# Patient Record
Sex: Male | Born: 1980 | Race: Asian | Hispanic: No | Marital: Single | Smoking: Current every day smoker
Health system: Southern US, Community
[De-identification: ages and names within clinical notes are randomized; demographics above are authoritative.]

---

## 2003-07-15 ENCOUNTER — Encounter: Payer: Self-pay | Admitting: Chiropractic Medicine

## 2003-07-15 ENCOUNTER — Encounter: Admission: RE | Admit: 2003-07-15 | Discharge: 2003-07-15 | Payer: Self-pay | Admitting: Chiropractic Medicine

## 2011-01-29 ENCOUNTER — Emergency Department (HOSPITAL_COMMUNITY)
Admission: EM | Admit: 2011-01-29 | Discharge: 2011-01-30 | Disposition: A | Discharge status: Home / Self Care | Payer: Worker's Compensation | Attending: Emergency Medicine | Admitting: Emergency Medicine

## 2011-01-29 DIAGNOSIS — S61209A Unspecified open wound of unspecified finger without damage to nail, initial encounter: Secondary | ICD-10-CM | POA: Insufficient documentation

## 2011-01-29 DIAGNOSIS — W319XXA Contact with unspecified machinery, initial encounter: Secondary | ICD-10-CM | POA: Insufficient documentation

## 2011-01-29 DIAGNOSIS — Y9269 Other specified industrial and construction area as the place of occurrence of the external cause: Secondary | ICD-10-CM | POA: Insufficient documentation

## 2011-01-29 DIAGNOSIS — Y990 Civilian activity done for income or pay: Secondary | ICD-10-CM | POA: Insufficient documentation

## 2011-01-30 ENCOUNTER — Emergency Department (HOSPITAL_COMMUNITY): Payer: Worker's Compensation

## 2011-09-10 IMAGING — CR DG FINGER RING 2+V*L*
3 series · 3 of 3 positions shown · non-contrast
Comparison: None.

CLINICAL DATA: Laceration to the left ring finger.

LEFT RING FINGER 2+V

[x finger pa left]
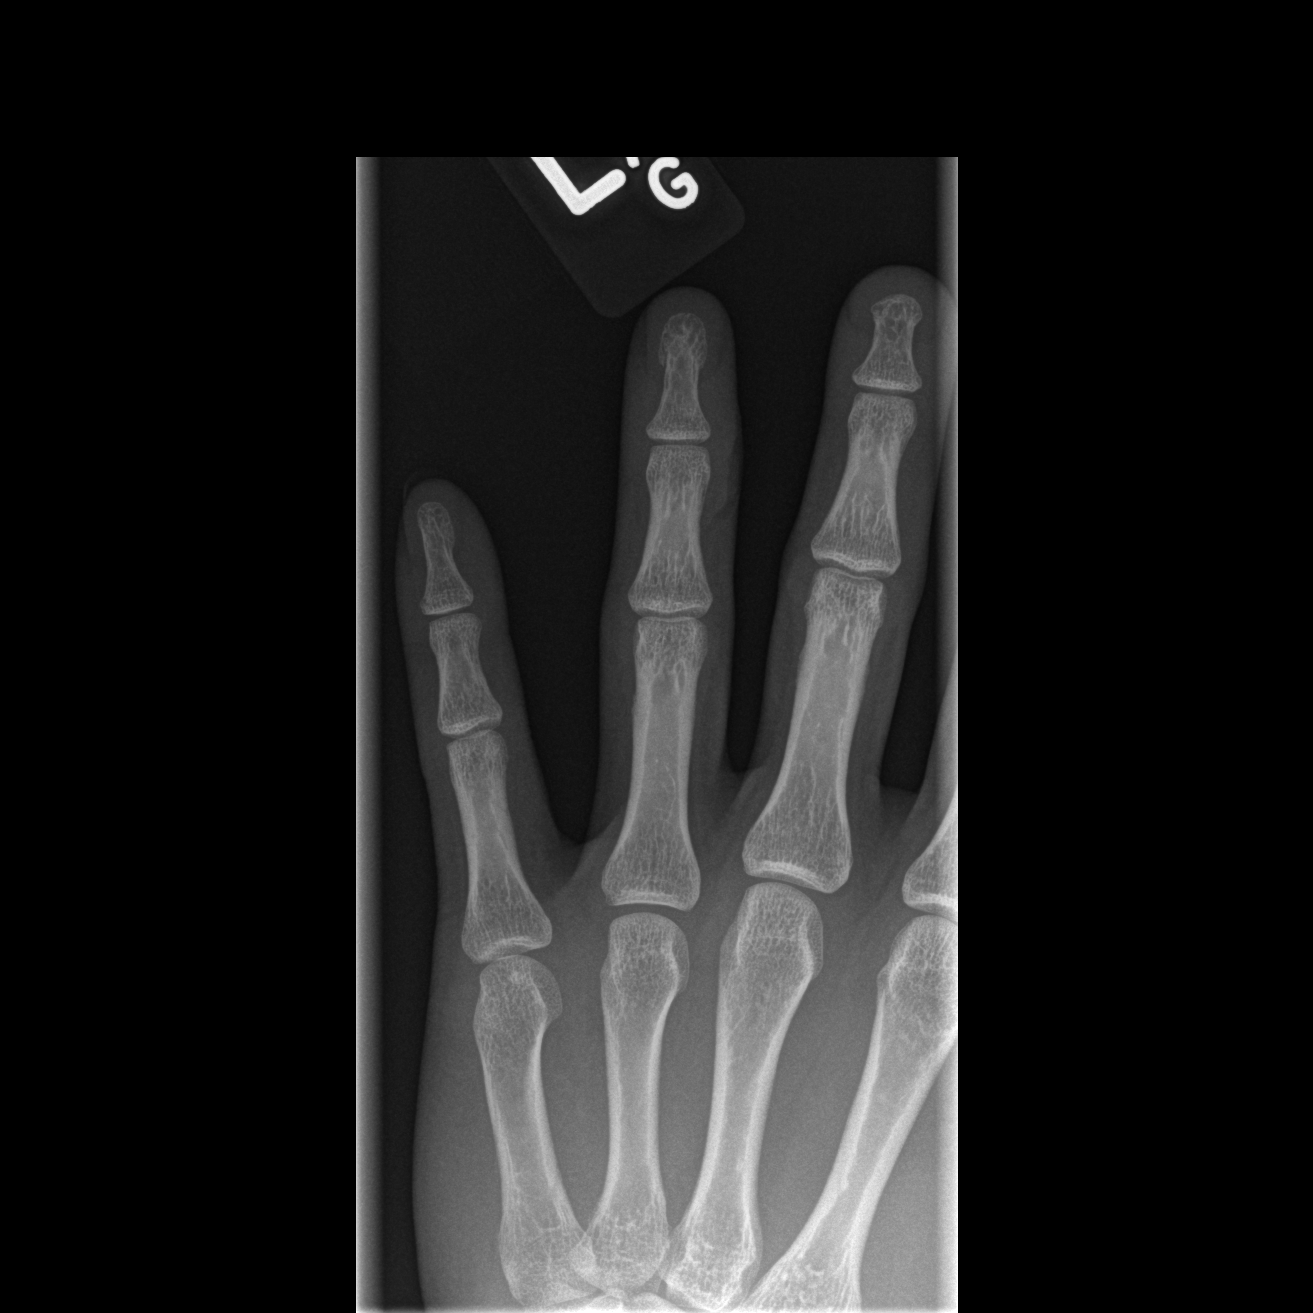

[x finger obl. left]
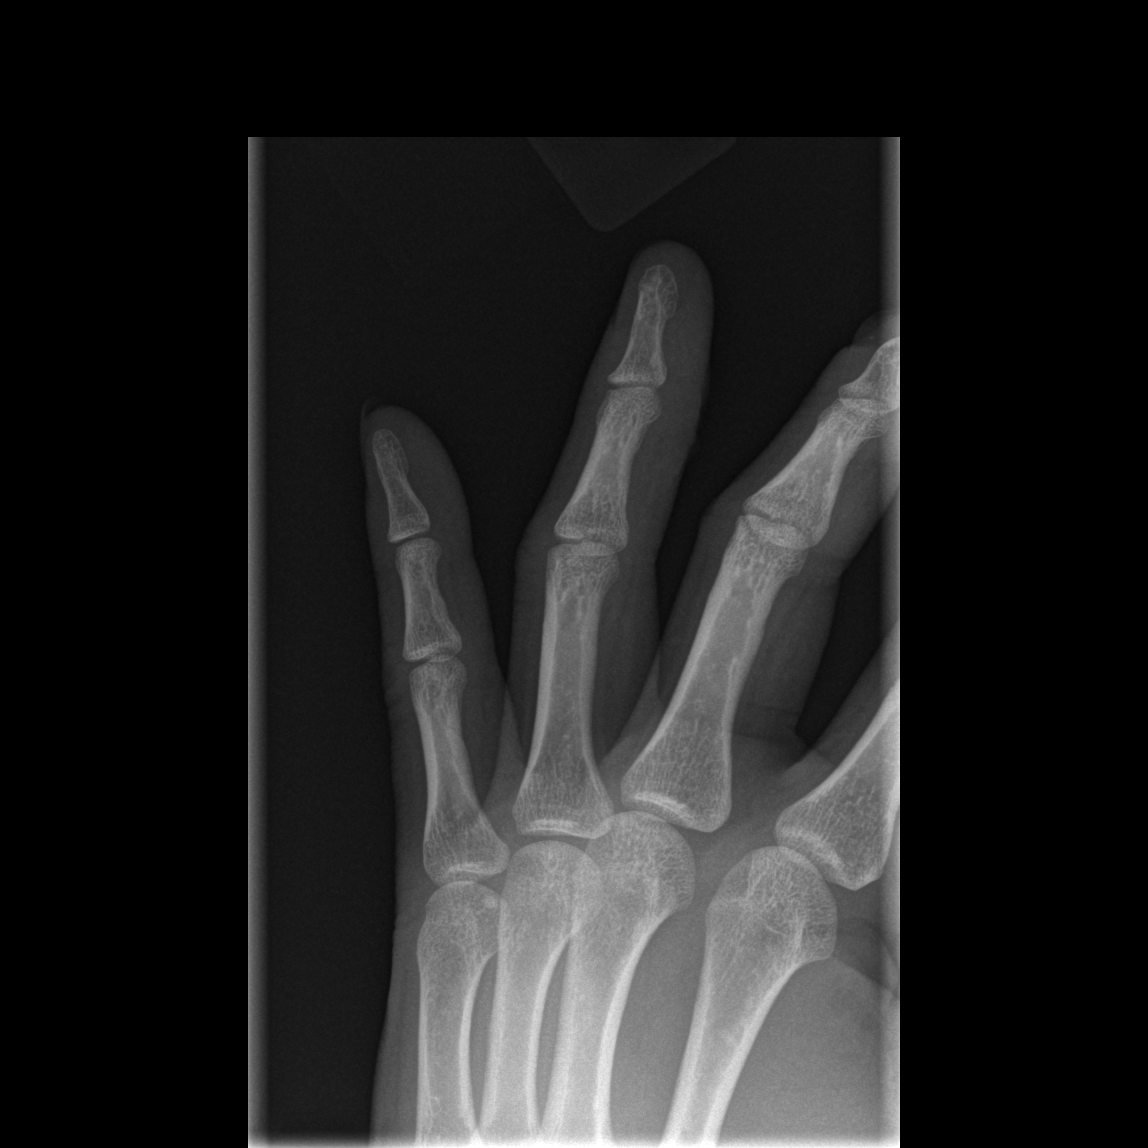

[x finger lateral left]
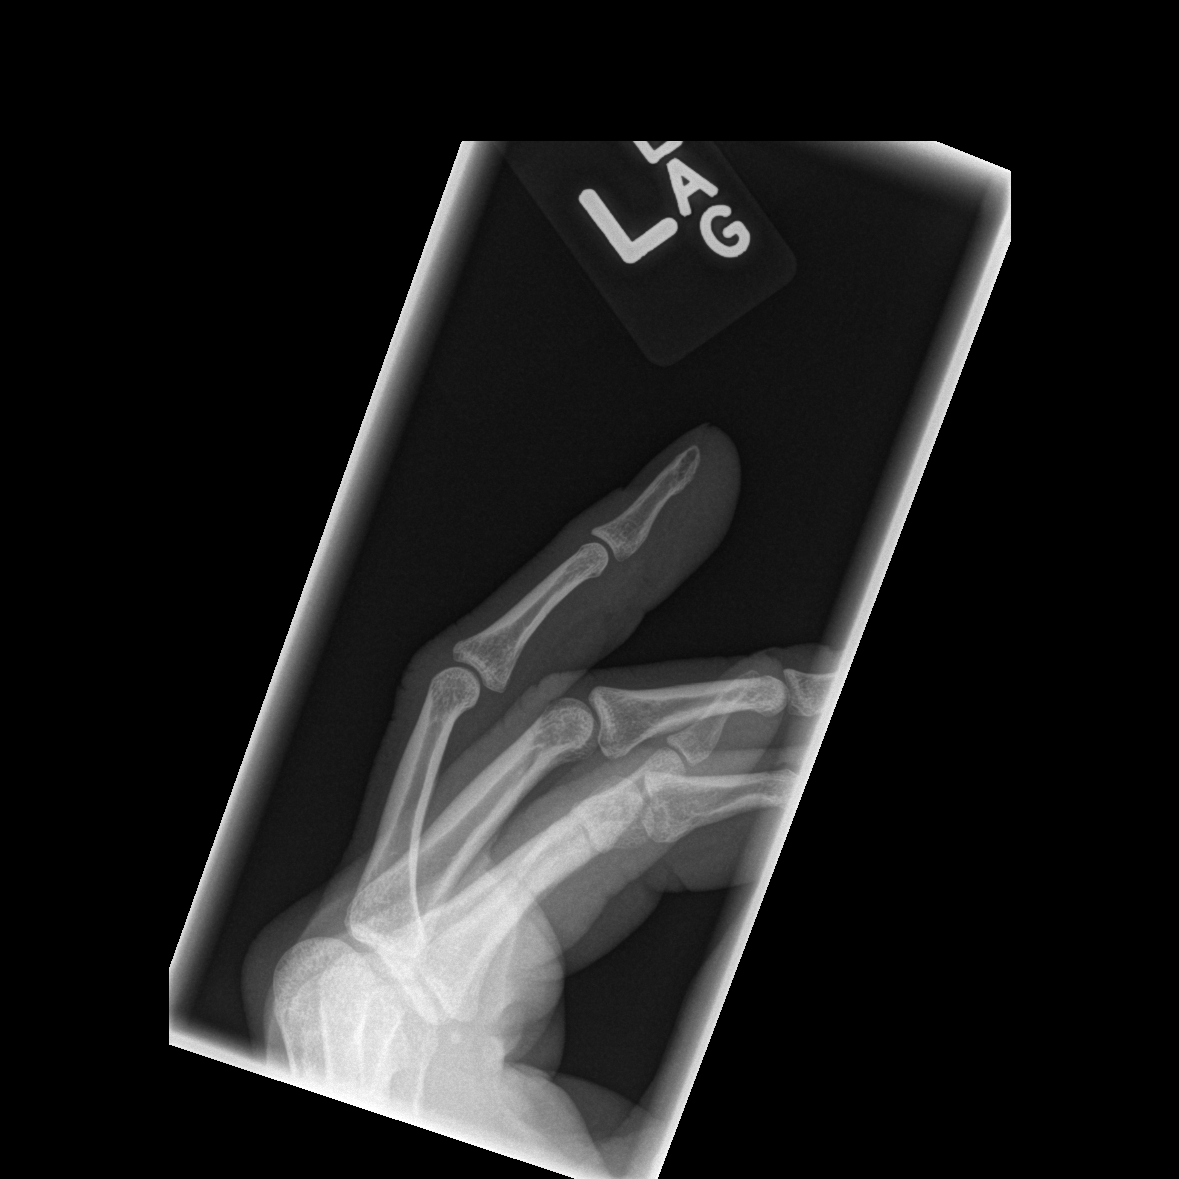

[3 of 3 positions shown; findings below may reference images not displayed]

FINDINGS: A soft tissue injury is present along the lateral aspect
of the middle phalanx in the ring finger.  There is no underlying
fracture.  No radiopaque foreign body is seen.  Soft tissue
swelling is evident.  The joints are located.
IMPRESSION: 1.  Soft tissue laceration and swelling without underlying fracture
or dislocation.
2.  No radiopaque foreign body.

## 2012-11-13 ENCOUNTER — Ambulatory Visit (INDEPENDENT_AMBULATORY_CARE_PROVIDER_SITE_OTHER): Payer: 59 | Admitting: Internal Medicine

## 2012-11-13 VITALS — BP 108/84 | HR 62 | Temp 98.0°F | Resp 16 | Ht 63.5 in | Wt 136.6 lb

## 2012-11-13 DIAGNOSIS — S058X9A Other injuries of unspecified eye and orbit, initial encounter: Secondary | ICD-10-CM

## 2012-11-13 DIAGNOSIS — H5710 Ocular pain, unspecified eye: Secondary | ICD-10-CM

## 2012-11-13 DIAGNOSIS — S0500XA Injury of conjunctiva and corneal abrasion without foreign body, unspecified eye, initial encounter: Secondary | ICD-10-CM

## 2012-11-13 MED ORDER — HYDROCODONE-ACETAMINOPHEN 5-325 MG PO TABS: 1.0000 | ORAL_TABLET | Freq: Four times a day (QID) | ORAL | Status: AC | PRN

## 2012-11-13 MED ORDER — OFLOXACIN 0.3 % OP SOLN: 1.0000 [drp] | OPHTHALMIC | Status: AC

## 2012-11-13 NOTE — Progress Notes (Signed)
  Subjective:    Patient ID: Brian Newman, male    DOB: Mar 04, 1981, 31 y.o.   MRN: [id number]  HPI Eye pain for2days Was rubbing eye after dust got into eye, later felt sharp pain. No vision change No eye diseases.   Review of Systems     Objective:   Physical Exam  Vitals reviewed. Constitutional: He is oriented to person, place, and time. He appears well-developed and well-nourished.  Eyes: EOM are normal. Pupils are equal, round, and reactive to light. No foreign bodies found. Right eye exhibits no discharge and no exudate. No foreign body present in the right eye. Left eye exhibits no discharge and no exudate. No foreign body present in the left eye. Right conjunctiva is not injected. Right conjunctiva has no hemorrhage. Left conjunctiva is not injected. Left conjunctiva has no hemorrhage.    Pulmonary/Chest: Effort normal.  Neurological: He is alert and oriented to person, place, and time. Coordination normal.  Psychiatric: He has a normal mood and affect.          Assessment & Plan:  Corneal abrasion Ocuflox/vicodin

## 2012-11-13 NOTE — Patient Instructions (Addendum)
Corneal Abrasion  The cornea is the clear covering at the front and center of the eye. When looking at the colored portion (iris) of the eye, you are looking through that person's cornea.   This very thin tissue is made up of many layers. The surface layer is a single layer of cells called the corneal epithelium. This is one of the most sensitive tissues in the body. If a scratch or injury causes the corneal epithelium to come off, it is called a corneal abrasion. If the injury extends to the tissues below the epithelium, the condition is called a corneal ulcer.   CAUSES    Scratches.   Trauma.   Foreign body in the eye.   Some people have recurrences of abrasions in the area of the original injury even after they heal. This is called recurrent erosion syndrome. Recurrent erosion syndromes generally improve and go away with time.  SYMPTOMS    Eye pain.   Difficulty or inability to keep the injured eye open.   The eye becomes very sensitive to light.   Recurrent erosions tend to happen suddenly, first thing in the morning  usually upon awakening and opening the eyes.  DIAGNOSIS   Your eye professional can diagnose a corneal abrasion during an eye exam. Dye is usually placed in the eye using a drop or a small paper strip moistened by the patient's tears. When the eye is examined with a special light, the abrasion shows up clearly because of the dye.  TREATMENT    Small abrasions may be treated with antibiotic drops or ointment alone.   Usually a pressure patch is specially applied. Pressure patches prevent the eye from blinking, allowing the corneal epithelium to heal. Because blinking is less, a pressure patch also reduces the amount of pain present in the eye during healing. Most corneal abrasions heal within 2-3 days with no effect on vision. WARNING: Do not drive or operate machinery while your eye is patched. Your ability to judge distances is impaired.   If abrasion becomes infected and spreads to the  deeper tissues of the cornea, a corneal ulcer can result. This is serious because it can cause corneal scarring. Corneal scars interfere with light passing through the cornea, and cause a loss of vision in the involved eye.   If your caregiver has given you a follow-up appointment, it is very important to keep that appointment. Not keeping the appointment could result in a severe eye infection or permanent loss of vision. If there is any problem keeping the appointment, you must call back to this facility for assistance.  SEEK MEDICAL CARE IF:    You have pain, light sensitivity and a scratchy feeling in one eye (or both).   Your pressure patch keeps loosening up and you can blink your eye under the patch after treatment.   Any kind of discharge develops from the involved eye after treatment or if the lids stick together in the morning.   You have the same symptoms in the morning as you did with the original abrasion days, weeks or months after the abrasion healed.  MAKE SURE YOU:    Understand these instructions.   Will watch your condition.   Will get help right away if you are not doing well or get worse.  Document Released: 11/23/2000 Document Revised: 02/18/2012 Document Reviewed: 07/01/2008  [employer] Patient Information 2013 [employer]

## 2020-03-25 ENCOUNTER — Ambulatory Visit: Payer: Self-pay | Attending: Internal Medicine

## 2020-03-25 DIAGNOSIS — Z23 Encounter for immunization: Secondary | ICD-10-CM

## 2020-03-25 NOTE — Progress Notes (Signed)
° °  Covid-19 Vaccination Clinic  Name:  Brian Newman    MRN: [id number] DOB: [birth year]  03/25/2020  Mr. Brian Newman was observed post Covid-19 immunization for 15 minutes without incident. He was provided with Vaccine Information Sheet and instruction to access the V-Safe system.   Mr. Brian Newman was instructed to call 911 with any severe reactions post vaccine:  Difficulty breathing   Swelling of face and throat   A fast heartbeat   A bad rash all over body   Dizziness and weakness   Immunizations Administered    Name Date Dose VIS Date Route   Pfizer COVID-19 Vaccine 03/25/2020 12:03 PM 0.3 mL 11/20/2019 Intramuscular   Manufacturer: [employer], [employer]   Lot: [id number]   NDC: [address]-1

## 2020-04-18 ENCOUNTER — Ambulatory Visit: Payer: Self-pay | Attending: Internal Medicine

## 2020-04-18 DIAGNOSIS — Z23 Encounter for immunization: Secondary | ICD-10-CM

## 2020-04-18 NOTE — Progress Notes (Signed)
   Covid-19 Vaccination Clinic  Name:  Brian Newman    MRN: [id number] DOB: 1981-10-21  04/18/2020  Brian Newman was observed post Covid-19 immunization for 15 minutes without incident. He was provided with Vaccine Information Sheet and instruction to access the V-Safe system.   Brian Newman was instructed to call 911 with any severe reactions post vaccine: Marland Kitchen Difficulty breathing  . Swelling of face and throat  . A fast heartbeat  . A bad rash all over body  . Dizziness and weakness   Immunizations Administered    Name Date Dose VIS Date Route   Pfizer COVID-19 Vaccine 04/18/2020  2:20 PM 0.3 mL 02/03/2019 Intramuscular   Manufacturer: [employer], [employer]   Lot: [id number]   NDC: [address]-8
